# Patient Record
Sex: Male | Born: 1975 | Race: White | Hispanic: No | Marital: Married | Smoking: Never smoker
Health system: Southern US, Community
[De-identification: ages and names within clinical notes are randomized; demographics above are authoritative.]

## PROBLEM LIST (undated history)

## (undated) DIAGNOSIS — I10 Essential (primary) hypertension: Secondary | ICD-10-CM

## (undated) DIAGNOSIS — E669 Obesity, unspecified: Secondary | ICD-10-CM

## (undated) HISTORY — PX: OTHER SURGICAL HISTORY: SHX169

---

## 2005-05-28 ENCOUNTER — Emergency Department: Payer: Self-pay | Admitting: Internal Medicine

## 2007-05-22 ENCOUNTER — Ambulatory Visit: Payer: Self-pay | Admitting: Emergency Medicine

## 2010-05-19 ENCOUNTER — Ambulatory Visit: Payer: Self-pay | Admitting: Surgery

## 2010-05-26 ENCOUNTER — Ambulatory Visit: Payer: Self-pay | Admitting: Surgery

## 2013-12-23 DIAGNOSIS — I10 Essential (primary) hypertension: Secondary | ICD-10-CM | POA: Insufficient documentation

## 2013-12-23 DIAGNOSIS — Z9289 Personal history of other medical treatment: Secondary | ICD-10-CM | POA: Insufficient documentation

## 2013-12-23 DIAGNOSIS — E669 Obesity, unspecified: Secondary | ICD-10-CM | POA: Insufficient documentation

## 2013-12-23 DIAGNOSIS — E66813 Obesity, class 3: Secondary | ICD-10-CM | POA: Insufficient documentation

## 2014-08-23 ENCOUNTER — Emergency Department: Payer: Self-pay | Admitting: Emergency Medicine

## 2014-08-23 LAB — CBC
HCT: 45.1 % (ref 40.0–52.0)
HGB: 15.2 g/dL (ref 13.0–18.0)
MCH: 29 pg (ref 26.0–34.0)
MCHC: 33.6 g/dL (ref 32.0–36.0)
MCV: 86 fL (ref 80–100)
Platelet: 143 10*3/uL — ABNORMAL LOW (ref 150–440)
RBC: 5.22 10*6/uL (ref 4.40–5.90)
RDW: 13.5 % (ref 11.5–14.5)
WBC: 9.7 10*3/uL (ref 3.8–10.6)

## 2014-08-23 LAB — BASIC METABOLIC PANEL
Anion Gap: 5 — ABNORMAL LOW (ref 7–16)
BUN: 17 mg/dL (ref 7–18)
CALCIUM: 8.9 mg/dL (ref 8.5–10.1)
CHLORIDE: 105 mmol/L (ref 98–107)
Co2: 27 mmol/L (ref 21–32)
Creatinine: 1.05 mg/dL (ref 0.60–1.30)
EGFR (African American): >60
EGFR (Non-African Amer.): >60
Glucose: 135 mg/dL — ABNORMAL HIGH (ref 65–99)
Osmolality: 277 (ref 275–301)
POTASSIUM: 4.4 mmol/L (ref 3.5–5.1)
Sodium: 137 mmol/L (ref 136–145)

## 2015-03-23 ENCOUNTER — Ambulatory Visit
Admission: EM | Admit: 2015-03-23 | Discharge: 2015-03-23 | Disposition: A | Discharge status: Home / Self Care | Payer: BLUE CROSS/BLUE SHIELD | Attending: Family Medicine | Admitting: Family Medicine

## 2015-03-23 ENCOUNTER — Encounter: Payer: Self-pay | Admitting: Emergency Medicine

## 2015-03-23 DIAGNOSIS — J020 Streptococcal pharyngitis: Secondary | ICD-10-CM | POA: Insufficient documentation

## 2015-03-23 DIAGNOSIS — J0100 Acute maxillary sinusitis, unspecified: Secondary | ICD-10-CM | POA: Diagnosis not present

## 2015-03-23 DIAGNOSIS — J029 Acute pharyngitis, unspecified: Secondary | ICD-10-CM | POA: Diagnosis present

## 2015-03-23 DIAGNOSIS — Z79899 Other long term (current) drug therapy: Secondary | ICD-10-CM | POA: Diagnosis not present

## 2015-03-23 LAB — RAPID STREP SCREEN (MED CTR MEBANE ONLY): STREPTOCOCCUS, GROUP A SCREEN (DIRECT): POSITIVE — AB

## 2015-03-23 MED ORDER — AMOXICILLIN-POT CLAVULANATE 875-125 MG PO TABS: 1.0000 | ORAL_TABLET | Freq: Two times a day (BID) | ORAL | Status: DC

## 2015-03-23 NOTE — ED Provider Notes (Signed)
CSN: [id number]     Arrival date & time 03/23/15  1642 History   First MD Initiated Contact with Patient 03/23/15 1742     Chief Complaint  Patient presents with  . Sore Throat   (Consider location/radiation/quality/duration/timing/severity/associated sxs/prior Treatment) Patient is a 39 y.o. male presenting with pharyngitis. The history is provided by the patient.  Sore Throat This is a new problem. The current episode started more than 1 week ago (3 weeks). The problem occurs constantly. The problem has not changed since onset.Associated symptoms comments: Sinus pressure, sinus headaches, nasal congestion, sweats.    History reviewed. No pertinent past medical history. History reviewed. No pertinent past surgical history. History reviewed. No pertinent family history. History  Substance Use Topics  . Smoking status: Never Smoker   . Smokeless tobacco: Never Used  . Alcohol Use: No    Review of Systems  Allergies  Review of patient's allergies indicates no known allergies.  Home Medications   Prior to Admission medications   Medication Sig Start Date End Date Taking? Authorizing Provider  amphetamine-dextroamphetamine (ADDERALL XR) 10 MG 24 hr capsule Take 10 mg by mouth daily.   Yes Historical Provider, MD  lisinopril (PRINIVIL,ZESTRIL) 10 MG tablet Take 10 mg by mouth daily.   Yes Historical Provider, MD  amoxicillin-clavulanate (AUGMENTIN) 875-125 MG per tablet Take 1 tablet by mouth 2 (two) times daily. 03/23/15   Payton Mccallum, MD   BP 127/29 mmHg  Pulse 102  Temp(Src) 99.1 F (37.3 C) (Tympanic)  Ht  (1.854 m)  Wt 325 lb (147.419 kg)  BMI 42.89 kg/m2  SpO2 93% Physical Exam  Constitutional: He appears well-developed and well-nourished. No distress.  HENT:  Head: Normocephalic and atraumatic.  Right Ear: Tympanic membrane, external ear and ear canal normal.  Left Ear: Tympanic membrane, external ear and ear canal normal.  Nose: Right sinus exhibits  maxillary sinus tenderness and frontal sinus tenderness. Left sinus exhibits maxillary sinus tenderness and frontal sinus tenderness.  Mouth/Throat: Uvula is midline and mucous membranes are normal. Posterior oropharyngeal erythema present. No oropharyngeal exudate, posterior oropharyngeal edema or tonsillar abscesses.  Eyes: Conjunctivae and EOM are normal. Pupils are equal, round, and reactive to light. Right eye exhibits no discharge. Left eye exhibits no discharge. No scleral icterus.  Neck: Normal range of motion. Neck supple. No tracheal deviation present. No thyromegaly present.  Cardiovascular: Normal rate, regular rhythm and normal heart sounds.   Pulmonary/Chest: Effort normal and breath sounds normal. No stridor. No respiratory distress. He has no wheezes. He has no rales. He exhibits no tenderness.  Lymphadenopathy:    He has no cervical adenopathy.  Neurological: He is alert.  Skin: Skin is warm and dry. No rash noted. He is not diaphoretic.  Nursing note and vitals reviewed.   ED Course  Procedures (including critical care time) Labs Review Labs Reviewed  RAPID STREP SCREEN (NOT AT [facility]) - Abnormal; Notable for the following:    Streptococcus, Group A Screen (Direct) POSITIVE (*)    All other components within normal limits    Imaging Review No results found.   MDM   1. Acute maxillary sinusitis, recurrence not specified   2. Strep pharyngitis    Discharge Medication List as of 03/23/2015  6:09 PM    START taking these medications   Details  amoxicillin-clavulanate (AUGMENTIN) 875-125 MG per tablet Take 1 tablet by mouth 2 (two) times daily., Starting 03/23/2015, Until Discontinued, Normal      Plan: 1. Test results  and diagnosis reviewed with patient 2. rx as per orders; risks, benefits, potential side effects reviewed with patient 3. Recommend supportive treatment with salt water gargles, otc analgesics 4. F/u prn if symptoms worsen or don't  improve    Payton Mccallum, MD 03/23/15 [phone]

## 2015-03-23 NOTE — ED Notes (Signed)
Patient states he has a sore throat and sinus drainage with cough persistently for 3 weeks. OTC medications aren't helping

## 2016-07-31 ENCOUNTER — Ambulatory Visit
Admission: AD | Admit: 2016-07-31 | Discharge: 2016-07-31 | Disposition: A | Discharge status: Home / Self Care | Payer: Self-pay

## 2016-07-31 DIAGNOSIS — L03116 Cellulitis of left lower limb: Secondary | ICD-10-CM

## 2016-07-31 HISTORY — DX: Essential (primary) hypertension: I10

## 2016-07-31 MED ORDER — DOXYCYCLINE HYCLATE 100 MG PO TABS *I*
100.0000 mg | ORAL_TABLET | Freq: Two times a day (BID) | ORAL | 0 refills | Status: AC
Start: 2016-07-31 — End: 2016-08-10

## 2016-07-31 NOTE — Discharge Instructions (Signed)
Recommend warm compress application 3 times daily for 15-20 minutes.     Recommend otc Tylenol and/or Motrin as needed for pain/fever relief. Use as directed on package labeling.      Elevate your legs.

## 2016-07-31 NOTE — UC Provider Note (Signed)
History     Chief Complaint   Patient presents with    Abscess     Abscess x one week on left lateral left - pt states he has swelling in his toes as well - traveling "[place] and down" the [employer] for work     Patient is a 40 y.o. male presenting with abscess.   History provided by:  Patient  Language interpreter used: No    Is this ED visit related to civilian activity for income:  Not work related  Abscess   Location:  Leg  Leg abscess location:  L lower leg  Abscess quality: induration, painful and redness    Red streaking: no    Duration:  3 days  Progression:  Worsening  Pain details:     Quality:  Sharp and shooting  Chronicity:  New  Context: not diabetes, not immunosuppression, not injected drug use and not insect bite/sting    Relieved by:  Draining/squeezing  Worsened by:  Nothing  Associated symptoms: no anorexia, no fatigue, no fever, no headaches, no nausea and no vomiting    Risk factors: no family hx of MRSA, no hx of MRSA and no prior abscess    Patient reports a "pimple" that he popped to his left lower leg 1 week ago. He states the area is now hard, tender and painful. He thinks it may have gotten infected. He has multiple boils to his groin and bilateral axilla. 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 He has a history of dependent edema in his legs that typically resolves with rest and elevation. He denies any fever, chills, sweats, chest pain, shortness of breath, cough, drainage from the leg, red streaking, calf pain. He has a hx of HTN - typically takes Lisinopril but ran out of his medication 1 year ago.     Past Medical History:   Diagnosis Date    Hypertension             History reviewed. No pertinent surgical history.    History reviewed. No pertinent family history.      Social History    reports that he has never smoked. He has never used smokeless tobacco. He reports that he does not drink alcohol. His drug and sexual activity histories are not on  file.    Living Situation     Questions Responses    Patient lives with     Homeless     Caregiver for other family member     External Services     Employment     Domestic Violence Risk           Review of Systems   Review of Systems   Constitutional: Negative for chills, diaphoresis, fatigue and fever.   Respiratory: Negative for cough, chest tightness, shortness of breath, wheezing and stridor.    Cardiovascular: Positive for leg swelling. Negative for chest pain and palpitations.   Gastrointestinal: Negative for anorexia, nausea and vomiting.   Genitourinary: Negative for decreased urine volume and difficulty urinating.   Musculoskeletal: Negative for gait problem.        Left leg pain   Skin:        Left leg boil with redness and swelling   Neurological: Negative for dizziness, light-headedness, numbness and headaches.   All other systems reviewed and are negative.      Physical Exam     ED Triage Vitals  BP Heart Rate Heart Rate (via Pulse Ox) Resp Temp Temp src SpO2 O2 Device O2 Flow Rate   07/31/16 2008 07/31/16 2008 -- 07/31/16 2008 07/31/16 2008 -- 07/31/16 2008 -- --   146/79 110  22 37.4 C (99.3 F)  94 %        Weight           --                               Physical Exam   Constitutional: He is oriented to person, place, and time. He appears well-developed and well-nourished. No distress.   HENT:   Head: Normocephalic and atraumatic.   Cardiovascular: Normal rate, regular rhythm, normal heart sounds and intact distal pulses.  Exam reveals no gallop and no friction rub.    No murmur heard.  Pulmonary/Chest: Effort normal and breath sounds normal. No respiratory distress. He has no wheezes. He has no rales.   Musculoskeletal:        Right lower leg: He exhibits swelling and edema. He exhibits no tenderness.        Left lower leg: He exhibits swelling and edema.        Legs:  Negative Homan's sign bilaterally. There is 2+ nonpitting edema noted to both lower legs. Both lower legs measure symmetrically  at 51 cm. No palpable cords, varicosities or calf tenderness.    Neurological: He is alert and oriented to person, place, and time.   Skin: Skin is warm and dry.   Psychiatric: He has a normal mood and affect. His behavior is normal. Judgment and thought content normal.   Nursing note and vitals reviewed.       Medical Decision Making        Initial Evaluation:  ED First Provider Contact     Date/Time Event User Comments    07/31/16 2002 ED Provider First Contact [place] to [facility] Provider Contact          Patient was seen on: 07/31/2016        Assessment:  39 y.o.male comes to the Ur[facility] with left lower leg pain at the site of an infected pimple with some redness and swelling x 3 days.     Differential Diagnosis includes Skin infection, Cellulitis, Abscess, TSS, Necrotizing fasciitis, erythema migrans, herpes zoster, septic arthritis/bursitis, osteomyelitis, contact dermatitis, vasculitis, acute gout, insect bite, DVT, stasis dermatitis, hydradenitis suppurativa, folliculitis.                 Plan:   Orders Placed This Encounter    doxycycline (VIBRA-TABS) 100 MG tablet       No results found for this or any previous visit (from the past 24 hour(s)).       Final Diagnosis    ICD-10-CM ICD-9-CM   1. Cellulitis of left leg L03.116 682.6       Encourage fluids, encourage rest, good hand hygiene.    Use over the counter medications as discussed.    Discharge Instructions    References/Attachments: Go to References/Attachments    CELLULITIS, ADULT (ENGLISH) View    Patient's Language: Unknown   Instructions      Recommend warm compress application 3 times daily for 15-20 minutes.     Recommend otc Tylenol and/or Motrin as needed for pain/fever relief. Use as directed on package labeling.      Elevate your legs.  Please start the new medications as below:    Current Discharge Medication List      New Medications    Details Last Dose Given Next Dose Due Script Given?   doxycycline  (VIBRA-TABS) 100 mg Dose: 100 mg  Take 100 mg by mouth 2 times daily  Quantity 20 tablet, Refill 0  Start date: 07/31/2016, End date: 08/10/2016       Comments: Emergency Encounter                   Please follow up with your physician as below:    Follow-up Information     Follow up with in the ED In 2 days.    Why:  If symptoms worsen, If symptoms do not improve        If short of breath, chest pains, worsening symptoms, or any other concerns please report to the emergency room.    In the event of an Emergency dial 911.     Final Diagnosis  Final diagnoses:   [L03.116] Cellulitis of left leg (Primary)           Concha Pyo, PA    Supervising physician Dorna Bloom, MD was immediately available     Concha Pyo, [place]  07/31/16 2049

## 2016-07-31 NOTE — ED Triage Notes (Signed)
Abscess x one week on left lateral left - pt states he has swelling in his toes as well [place]    Triage Note   Beverly Milchelia R Catlynn Grondahl, RN

## 2018-03-25 ENCOUNTER — Other Ambulatory Visit: Payer: Self-pay

## 2018-03-25 ENCOUNTER — Encounter: Payer: Self-pay | Admitting: Emergency Medicine

## 2018-03-25 ENCOUNTER — Ambulatory Visit
Admission: EM | Admit: 2018-03-25 | Discharge: 2018-03-25 | Disposition: A | Discharge status: Home / Self Care | Payer: BLUE CROSS/BLUE SHIELD | Attending: Family Medicine | Admitting: Family Medicine

## 2018-03-25 DIAGNOSIS — R21 Rash and other nonspecific skin eruption: Secondary | ICD-10-CM

## 2018-03-25 DIAGNOSIS — L255 Unspecified contact dermatitis due to plants, except food: Secondary | ICD-10-CM

## 2018-03-25 MED ORDER — MUPIROCIN 2 % EX OINT: 1.0000 "application " | TOPICAL_OINTMENT | Freq: Three times a day (TID) | CUTANEOUS | 0 refills | Status: DC

## 2018-03-25 MED ORDER — PREDNISONE 10 MG (21) PO TBPK: ORAL_TABLET | Freq: Every day | ORAL | 0 refills | Status: DC

## 2018-03-25 NOTE — Discharge Instructions (Signed)
Use Benadryl 50 mg at bedtime to help prevent itching at night.  In the daytime use Claritin Allegra or Zyrtec for itching.

## 2018-03-25 NOTE — ED Provider Notes (Signed)
[facility]    CSN: [id number] Arrival date & time: 03/25/18  1720     History   Chief Complaint Chief Complaint  Patient presents with  . Rash    HPI Lucas FelixJames C Matus Jr. is a 42 y.o. male.   HPI  42 year old male presents with a itchy rash all over the body for the past 9 days.  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  Shortly afterwards he noticed that the itching was severe.  It is spread from his arms to his torso and arch patch over his posterior O lateral left hip.       History reviewed. No pertinent past medical history.  There are no active problems to display for this patient.   History reviewed. No pertinent surgical history.     Home Medications    Prior to Admission medications   Medication Sig Start Date End Date Taking? Authorizing Provider  amphetamine-dextroamphetamine (ADDERALL XR) 10 MG 24 hr capsule Take 10 mg by mouth daily.   Yes [provider]  lisinopril (PRINIVIL,ZESTRIL) 10 MG tablet Take 10 mg by mouth daily.   Yes [provider]  mupirocin ointment (BACTROBAN) 2 % Apply 1 application topically 3 (three) times daily. 03/25/18   Lutricia Feiloemer, Lucetta Baehr P, PA-C  predniSONE (STERAPRED UNI-PAK 21 TAB) 10 MG (21) TBPK tablet Take by mouth daily. Take 6 tabs by mouth daily  for 2 days, then 5 tabs for 2 days, then 4 tabs for 2 days, then 3 tabs for 2 days, 2 tabs for 2 days, then 1 tab by mouth daily for 2 days 03/25/18   Lutricia Feiloemer, Avrum Kimball P, PA-C    Family History History reviewed. No pertinent family history.  Social History Social History   Tobacco Use  . Smoking status: Never Smoker  . Smokeless tobacco: Never Used  Substance Use Topics  . Alcohol use: No  . Drug use: No     Allergies   Patient has no known allergies.   Review of Systems Review of Systems  Constitutional: Positive for activity change. Negative for  chills, fatigue and fever.  Skin: Positive for rash and wound.  All other systems reviewed and are negative.    Physical Exam Triage Vital Signs ED Triage Vitals  Enc Vitals Group     BP 03/25/18 1837 (!) 134/94     Pulse Rate 03/25/18 1837 91     Resp 03/25/18 1837 16     Temp 03/25/18 1837 98.1 F (36.7 C)     Temp Source 03/25/18 1837 Oral     SpO2 03/25/18 1837 95 %     Weight 03/25/18 1834 (!) 400 lb (181.4 kg)     Height 03/25/18 1834 6\' 1"  (1.854 m)     Head Circumference --      Peak Flow --      Pain Score 03/25/18 1834 0     Pain Loc --      Pain Edu? --      Excl. in GC? --    No data found.  Updated Vital Signs BP (!) 134/94 (BP Location: Right Arm)   Pulse 91   Temp 98.1 F (36.7 C) (Oral)   Resp 16   Ht 6\' 1"  (1.854 m)   Wt (!) 400 lb (181.4 kg)   SpO2 95%   BMI 52.77 kg/m   Visual Acuity Right  Eye Distance:   Left Eye Distance:   Bilateral Distance:    Right Eye Near:   Left Eye Near:    Bilateral Near:     Physical Exam  Constitutional: He is oriented to person, place, and time. He appears well-developed and well-nourished. No distress.  HENT:  Head: Normocephalic.  Eyes: Pupils are equal, round, and reactive to light. Right eye exhibits no discharge. Left eye exhibits no discharge.  Neck: Normal range of motion.  Musculoskeletal: Normal range of motion.  Lymphadenopathy:    He has no cervical adenopathy.  Neurological: He is alert and oriented to person, place, and time.  Skin: Skin is warm and dry. Rash noted. He is not diaphoretic.  Emanation shows extensive rash with erythematous base and several areas of excoriations that are on the verge of infection.  Has there is a small linear vesicles present as well.  Psychiatric: He has a normal mood and affect. His behavior is normal. Judgment and thought content normal.  Nursing note and vitals reviewed.    UC Treatments / Results  Labs (all labs ordered are listed, but only abnormal  results are displayed) Labs Reviewed - No data to display  EKG None  Radiology No results found.  Procedures Procedures (including critical care time)  Medications Ordered in UC Medications - No data to display  Initial Impression / Assessment and Plan / UC Course  I have reviewed the triage vital signs and the nursing notes.  Pertinent labs & imaging results that were available during my care of the patient were reviewed by me and considered in my medical decision making (see chart for details).     Plan: 1. Test/x-ray results and diagnosis reviewed with patient 2. rx as per orders; risks, benefits, potential side effects reviewed with patient 3. Recommend supportive treatment with Benadryl at nighttime and Zyrtec Allegra or Claritin during daytime for itching.  To my prednisone for a 12-day taper.  Not improving he should follow-up with the dermatologist. 4. F/u prn if symptoms worsen or don't improve  Final Clinical Impressions(s) / UC Diagnoses   Final diagnoses:  Dermatitis due to plants, including poison ivy, sumac, and oak     Discharge Instructions     Use Benadryl 50 mg at bedtime to help prevent itching at night.  In the daytime use Claritin Allegra or Zyrtec for itching.    ED Prescriptions    Medication Sig Dispense Auth. Provider   predniSONE (STERAPRED UNI-PAK 21 TAB) 10 MG (21) TBPK tablet Take by mouth daily. Take 6 tabs by mouth daily  for 2 days, then 5 tabs for 2 days, then 4 tabs for 2 days, then 3 tabs for 2 days, 2 tabs for 2 days, then 1 tab by mouth daily for 2 days 42 tablet Ovid Curd P, PA-C   mupirocin ointment (BACTROBAN) 2 % Apply 1 application topically 3 (three) times daily. 22 g Lutricia Feil, PA-C     Controlled Substance Prescriptions [place] Controlled Substance Registry consulted? Not Applicable   Lutricia Feil, PA-C 03/25/18 1905

## 2018-03-25 NOTE — ED Triage Notes (Signed)
Patient c/o itchy rash all over his body for the past 9 days.

## 2020-01-07 ENCOUNTER — Other Ambulatory Visit: Payer: Self-pay

## 2020-01-07 ENCOUNTER — Ambulatory Visit
Admission: EM | Admit: 2020-01-07 | Discharge: 2020-01-07 | Disposition: A | Discharge status: Home / Self Care | Payer: Self-pay | Attending: Emergency Medicine | Admitting: Emergency Medicine

## 2020-01-07 DIAGNOSIS — Z760 Encounter for issue of repeat prescription: Secondary | ICD-10-CM | POA: Insufficient documentation

## 2020-01-07 DIAGNOSIS — I10 Essential (primary) hypertension: Secondary | ICD-10-CM | POA: Insufficient documentation

## 2020-01-07 HISTORY — DX: Obesity, unspecified: E66.9

## 2020-01-07 LAB — BASIC METABOLIC PANEL
Anion gap: 10 (ref 5–15)
BUN: 16 mg/dL (ref 6–20)
CO2: 29 mmol/L (ref 22–32)
Calcium: 8.9 mg/dL (ref 8.9–10.3)
Chloride: 99 mmol/L (ref 98–111)
Creatinine, Ser: 0.95 mg/dL (ref 0.61–1.24)
GFR calc Af Amer: >60 mL/min (ref >60)
GFR calc non Af Amer: >60 mL/min (ref >60)
Glucose, Bld: 156 mg/dL — ABNORMAL HIGH (ref 70–99)
Potassium: 3.8 mmol/L (ref 3.5–5.1)
Sodium: 138 mmol/L (ref 135–145)

## 2020-01-07 MED ORDER — LISINOPRIL 10 MG PO TABS: 10.0000 mg | ORAL_TABLET | Freq: Every day | ORAL | 0 refills | Status: DC

## 2020-01-07 NOTE — Discharge Instructions (Addendum)
Your kidney function and electrolytes were normal today.  Decrease your salt intake.  diet and exercise will lower your blood pressure significantly. It is important to keep your blood pressure under good control, as having a elevated blood pressure for prolonged periods of time significantly increases your risk of stroke, heart attacks, kidney damage, eye damage, and other problems. Measure your blood pressure once a day, preferably at the same time every day. Keep a log of this and bring it to your next doctor's appointment.  Bring your blood pressure cuff as well.  Return here in 2 weeks for blood pressure recheck if you're unable to find a primary care physician by then.  Cut back on the [facility] is much as you can.  Return immediately to the ER if you start having chest pain, headache, problems seeing, problems talking, problems walking, if you feel like you're about to pass out, if you do pass out, if you have a seizure, or for any other concerns.  Here is a list of primary care providers who are taking new patients:  Dr. Elizabeth Sauer, Dr. Schuyler Amor [address] [place] [address] [phone]  [facility] [address] [place] [address]  [phone]  [facility] [address] [place], [place] [address] [phone]  [facility] [address] [place]  [phone] [place], [place] [address]  Here are clinics/ other resources who will see you if you do not have insurance. Some have certain criteria that you must meet. Call them and find out what they are:  [facility]: [address] [place], [place] [address] Phone: [phone] Hours: First and Third Saturdays of each Month, 9 a.m. - 1 p.m.  [facility]: [address], [address] [place], [place] [address] Phone: [phone] Hours: Tuesday, 4 p.m. - 8 p.m. Thursday, 1 p.m. - 8 p.m. Wednesday, 9 a.m. - [facility] [address], [place], [place] [address] Phone: [phone] Pharmacy Phone Number: [phone] Dental Phone Number: [phone] [facility] Insurance Help: [phone]  Dental Hours: Monday - Thursday, 8 a.m. - 6 p.m.  [facility] [address], [place], [place] [address] Phone: [phone] Pharmacy Phone Number: [phone] [facility] Insurance Help: [phone]  [facility] [address] [place] [place], [place] [address] Phone: [phone] Pharmacy Phone Number: [phone] [facility] Insurance Help: [phone]  [facility] [address] [place], [place] [address] Phone: [phone] [facility] Insurance Help: [phone]   [facility] [address] [place], [place] [address] Phone: [phone]  Go to www.goodrx.com to look up your medications. This will give you a list of where you can find your prescriptions at the most affordable prices. Or ask the pharmacist what the cash price is, or if they have any other discount programs available to help make your medication more affordable. This can be less expensive than what you would pay with insurance.

## 2020-01-07 NOTE — ED Triage Notes (Signed)
Pt presents with c/o history of HTN and has not taken any medications in several years. Pt reports he does not currently have a PCP. Pt states he recently had readings of 150/120-160/120. Pt states he does check his BP regularly and it has been running high for over a year.

## 2020-01-07 NOTE — ED Provider Notes (Signed)
HPI  SUBJECTIVE:  Lucas Pierce. is a 44 y.o. 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  Patient states that they measured his blood pressure twice and it was 154/120 repeat was 160/120.  He was told that he needed to see an MD prior to being cleared to return for evaluation and came here as he does not have a primary care physician.  He states that he was on lisinopril 20 mg daily and has not been on any in the past year and a half.  He also states that he used to measure his blood pressure daily at home, but has also not done so in the past year and a half.  He states that the lisinopril 20 mg was working well for him.  He denies chest pain, headache, shortness of breath, arm or leg weakness, facial droop, visual changes, tearing pain through to his back, abdominal pain anuria, hematuria, seizures, syncope, worsening lower extremity edema.  No illicit drug or decongestant use.  Patient states that he drinks a case of [facility] a day.  He denies alcohol use.  Past medical history of hypertension, morbid obesity.  No history of diabetes, chronic kidney disease. PMD: None.  Patient's blood pressure last several visits at [facility] in February/March 2020 were 166/97, 151/93, 157/107.    Past Medical History:  Diagnosis Date  . Obesity     History reviewed. No pertinent surgical history.  Family History  Problem Relation Age of Onset  . Healthy Mother   . Healthy Father     Social History   Tobacco Use  . Smoking status: Never Smoker  . Smokeless tobacco: Never Used  Substance Use Topics  . Alcohol use: No  . Drug use: No    No current facility-administered medications for this encounter.  Current Outpatient Medications:  .  lisinopril (ZESTRIL) 10 MG tablet, Take 1 tablet (10 mg total) by mouth daily., Disp: 30 tablet, Rfl: 0  No Known Allergies   ROS  As noted in HPI.   Physical Exam  BP (!) 138/95 (BP Location: Left  Arm)   Pulse 95   Temp 97.7 F (36.5 C) (Oral)   Resp 19   Ht 6\' 1"  (1.854 m)   Wt (!) 179.2 kg   SpO2 92%   BMI 52.11 kg/m    BP Readings from Last 3 Encounters:  01/07/20 (!) 138/95  03/25/18 (!) 134/94  03/23/15 (!) 127/29   Constitutional: Well developed, well nourished, no acute distress.  Morbidly obese. Eyes:  EOMI, conjunctiva normal bilaterally HENT: Normocephalic, atraumatic,mucus membranes moist Respiratory: Normal inspiratory effort lungs clear bilaterally. Cardiovascular: Normal rate regular rhythm no murmurs rubs gallops GI: nondistended skin: No rash, skin intact Musculoskeletal: no deformities trace edema lower extremities bilaterally. Neurologic: Alert & oriented x 3, no focal neuro deficits Psychiatric: Speech and behavior appropriate   ED Course   Medications - No data to display  Orders Placed This Encounter  Procedures  . Basic metabolic panel    Standing Status:   Standing    Number of Occurrences:   1    Results for orders placed or performed during the hospital encounter of 01/07/20 (from the past 24 hour(s))  Basic metabolic panel     Status: Abnormal   Collection Time: 01/07/20  2:39 PM  Result Value Ref Range   Sodium 138 135 - 145 mmol/L   Potassium 3.8 3.5 - 5.1 mmol/L  Chloride 99 98 - 111 mmol/L   CO2 29 22 - 32 mmol/L   Glucose, Bld 156 (H) 70 - 99 mg/dL   BUN 16 6 - 20 mg/dL   Creatinine, Ser 5.63 0.61 - 1.24 mg/dL   Calcium 8.9 8.9 - 89.3 mg/dL   GFR calc non Af Amer >60 >60 mL/min   GFR calc Af Amer >60 >60 mL/min   Anion gap 10 5 - 15   No results found.  ED Clinical Impression  1. Essential hypertension   2. Medication refill      ED Assessment/Plan  Outside records reviewed.  As noted in HPI.   Patient states that he is not sure what his blood pressures been running for the past year and a half since he has been off of his medications.  His blood pressure is borderline hypertensive here today, however I have  records of his blood pressure being elevated three times. Will check a BMP since his last lab work was over a year ago.  Will start him on lisinopril 10 mg as this worked well for him in the past, have him keep a log of his blood pressure at home.  We will give him a list of primary care providers for him to follow-up with.  Discussed with him that he needs to follow-up with somebody in the next week or 2 for reevaluation, further management, and for clearance to perform employment test.  Discussed with him that he may need to discontinue the lisinopril if he starts to have side effects such as dizziness, lightheadedness, or if his blood pressure becomes low.  BMP unremarkable.  Kidney function is normal.  Plan as above.  Patient is requesting a note stating his blood pressure here,  the interventions done today, and plan for follow-up, as I am not comfortable with filling out the form that he provided to me.  Discussed labs,  MDM, treatment plan, and plan for follow-up with patient. Discussed sn/sx that should prompt return to the ED. patient agrees with plan.   Meds ordered this encounter  Medications  . lisinopril (ZESTRIL) 10 MG tablet    Sig: Take 1 tablet (10 mg total) by mouth daily.    Dispense:  30 tablet    Refill:  0    *This clinic note was created using Scientist, clinical (histocompatibility and immunogenetics). Therefore, there may be occasional mistakes despite careful proofreading.   ?    Domenick Gong, MD 01/07/20 1635

## 2020-04-20 ENCOUNTER — Telehealth: Payer: Self-pay

## 2020-04-20 ENCOUNTER — Other Ambulatory Visit: Payer: Self-pay

## 2020-04-20 ENCOUNTER — Ambulatory Visit
Admission: EM | Admit: 2020-04-20 | Discharge: 2020-04-20 | Disposition: A | Discharge status: Home / Self Care | Payer: BC Managed Care – PPO | Attending: Emergency Medicine | Admitting: Emergency Medicine

## 2020-04-20 ENCOUNTER — Ambulatory Visit (INDEPENDENT_AMBULATORY_CARE_PROVIDER_SITE_OTHER): Payer: BC Managed Care – PPO

## 2020-04-20 DIAGNOSIS — I10 Essential (primary) hypertension: Secondary | ICD-10-CM | POA: Insufficient documentation

## 2020-04-20 DIAGNOSIS — Z760 Encounter for issue of repeat prescription: Secondary | ICD-10-CM | POA: Diagnosis present

## 2020-04-20 DIAGNOSIS — M109 Gout, unspecified: Secondary | ICD-10-CM | POA: Insufficient documentation

## 2020-04-20 DIAGNOSIS — M79672 Pain in left foot: Secondary | ICD-10-CM

## 2020-04-20 LAB — URIC ACID: Uric Acid, Serum: 8.8 mg/dL — ABNORMAL HIGH (ref 3.7–8.6)

## 2020-04-20 MED ORDER — LISINOPRIL 20 MG PO TABS: 20.0000 mg | ORAL_TABLET | Freq: Every day | ORAL | 2 refills | Status: DC

## 2020-04-20 MED ORDER — INDOMETHACIN 25 MG PO CAPS: 50.0000 mg | ORAL_CAPSULE | Freq: Three times a day (TID) | ORAL | 0 refills | Status: AC | PRN

## 2020-04-20 MED ORDER — PREDNISONE 20 MG PO TABS: 40.0000 mg | ORAL_TABLET | Freq: Every day | ORAL | 0 refills | Status: AC

## 2020-04-20 MED ORDER — COLCHICINE 0.6 MG PO TABS: ORAL_TABLET | ORAL | 0 refills | Status: DC

## 2020-04-20 NOTE — ED Triage Notes (Signed)
Patient states that he is here for possible gout in his left big toe. States that this started over the last few days.

## 2020-04-20 NOTE — ED Provider Notes (Signed)
HPI  SUBJECTIVE:  Lucas Pierce. is a 44 y.o. male who presents with sharp pain at the first left MTP starting this morning.  States that it is hypersensitive, erythematous, swollen.  States that he has been eating a lot of seafood and red meat recently.  No trauma, change in his physical activity.  No numbness, tingling, fevers, body aches.  Denies pain or injuries to the rest of the foot or ankle.  He has been taking Advil 400 mg every 6 hours and tried an unknown over-the-counter "fluid pill".  He states that the Advil slightly helps temporarily.  Symptoms worse with weightbearing, toe movement at the MTP.  He states that he has had similar symptoms over the past month, but that it has resolved each time on its own.  He has a past medical history of hypertension, states that he is compliant with his 20 mg of lisinopril.  He is overweight, states that he is in the process of losing weight.  No history of diabetes, gout, chronic kidney disease.  PMD: None.   Past Medical History:  Diagnosis Date  . Obesity     History reviewed. No pertinent surgical history.  Family History  Problem Relation Age of Onset  . Healthy Mother   . Healthy Father     Social History   Tobacco Use  . Smoking status: Never Smoker  . Smokeless tobacco: Never Used  Vaping Use  . Vaping Use: Never used  Substance Use Topics  . Alcohol use: No  . Drug use: No    No current facility-administered medications for this encounter.  Current Outpatient Medications:  .  colchicine 0.6 MG tablet, 2 tabs po x 1, then one tab po 1 hour later, Disp: 6 tablet, Rfl: 0 .  indomethacin (INDOCIN) 25 MG capsule, Take 2 capsules (50 mg total) by mouth 3 (three) times daily as needed for up to 5 days., Disp: 30 capsule, Rfl: 0 .  lisinopril (ZESTRIL) 20 MG tablet, Take 1 tablet (20 mg total) by mouth daily., Disp: 30 tablet, Rfl: 2 .  predniSONE (DELTASONE) 20 MG tablet, Take 2 tablets (40 mg total) by mouth daily with  breakfast for 5 days., Disp: 10 tablet, Rfl: 0  No Known Allergies   ROS  As noted in HPI.   Physical Exam  BP (!) 165/102 (BP Location: Left Arm)   Pulse 94   Temp 98.2 F (36.8 C) (Oral)   Resp 16   Ht 6\' 1"  (1.854 m)   Wt (!) 172.4 kg   SpO2 94%   BMI 50.13 kg/m   Constitutional: Well developed, well nourished, no acute distress Eyes:  EOMI, conjunctiva normal bilaterally HENT: Normocephalic, atraumatic,mucus membranes moist Respiratory: Normal inspiratory effort Cardiovascular: Normal rate GI: nondistended skin: No rash, skin intact Musculoskeletal: Swelling of the dorsum of the left foot.  Left foot erythematous.    Positive tenderness at the first MTP.  No tenderness along the midfoot, metatarsals, phalanges.  Patient able to move  first toe.  Sensation grossly intact.  No tenderness over the calcaneus, ankle, or the rest of the foot.  DP 2+.  Skin intact.        Neurologic: Alert & oriented x 3, no focal neuro deficits Psychiatric: Speech and behavior appropriate   ED Course   Medications - No data to display  Orders Placed This Encounter  Procedures  . DG Foot Complete Left    Standing Status:   Standing  Number of Occurrences:   1    Order Specific Question:   Reason for Exam (SYMPTOM  OR DIAGNOSIS REQUIRED)    Answer:   Pain, tenderness first MTP.  Rule out fracture.  Concern for gout.  . Uric acid    Standing Status:   Standing    Number of Occurrences:   1    Results for orders placed or performed during the hospital encounter of 04/20/20 (from the past 24 hour(s))  Uric acid     Status: Abnormal   Collection Time: 04/20/20  2:53 PM  Result Value Ref Range   Uric Acid, Serum 8.8 (H) 3.7 - 8.6 mg/dL   DG Foot Complete Left  Result Date: 04/20/2020 CLINICAL DATA:  Pain and tenderness the first MTP joint. Question gout. Pain over the last few days. EXAM: LEFT FOOT - COMPLETE 3+ VIEW COMPARISON:  None. FINDINGS: Soft swelling is noted medial  to the left first MTP joint. No osseous changes are present. No rosea insert noted. Joint space is preserved. Mineralization is normal. No acute or focal osseous abnormalities are present. IMPRESSION: 1. Soft tissue swelling medial to the left first MTP joint without osseous changes. This is nonspecific. While this may represent inflammatory arthritis, posttraumatic changes could give a similar appearance. 2. No acute or focal osseous abnormality. Electronically Signed   By: Marin Roberts M.D.   On: 04/20/2020 15:28   ED Clinical Impression  1. Foot pain, left   2. Acute gout involving toe of left foot, unspecified cause   3. Essential hypertension   4. Medication refill      ED Assessment/Plan  Kidney function from 3/21 normal.  Patient states that his high blood pressure has been under good control at home recently with a 20 mg of lisinopril daily.  States it is elevated because of the pain.  XR foot, uric acid. Will not repeat BMP as this was recently done.   Reviewed imaging independently. Soft tissue swelling medial to the left first MTP joint without osseous changes.  No fracture.  See radiology report for full details. Uric acid slightly elevated.  Presentation suggestive of gout.  Home with colchicine, prednisone, indomethicin, tylenol, ice, elevation, f/u with podiatry.  Dr. Excell Seltzer on call.  Will contact patient [phone] if uric acid is elevated.  Called pt and discussed elevated uric acid.  Answered all questions.  Patient also requesting refill of lisinopril 20 mg for his hypertension.  States it controls his blood pressure well.  States that he has been taking 20 mg for the past several weeks.  Again encouraged primary care follow-up for monitoring of his blood pressure and to continue keeping a log of it.   will give him 3 months of medication to give him time to find a PMD-states that he called all the practices on the list that I gave him last time, there is no  availability until November.  Discussed labs, imaging, MDM, treatment plan, and plan for follow-up with patientpatient agrees with plan.   Meds ordered this encounter  Medications  . colchicine 0.6 MG tablet    Sig: 2 tabs po x 1, then one tab po 1 hour later    Dispense:  6 tablet    Refill:  0  . predniSONE (DELTASONE) 20 MG tablet    Sig: Take 2 tablets (40 mg total) by mouth daily with breakfast for 5 days.    Dispense:  10 tablet    Refill:  0  .  indomethacin (INDOCIN) 25 MG capsule    Sig: Take 2 capsules (50 mg total) by mouth 3 (three) times daily as needed for up to 5 days.    Dispense:  30 capsule    Refill:  0  . lisinopril (ZESTRIL) 20 MG tablet    Sig: Take 1 tablet (20 mg total) by mouth daily.    Dispense:  30 tablet    Refill:  2    *This clinic note was created using Scientist, clinical (histocompatibility and immunogenetics). Therefore, there may be occasional mistakes despite careful proofreading.   ?    Domenick Gong, MD 04/21/20 [phone]

## 2020-04-20 NOTE — Discharge Instructions (Addendum)
Your x-ray was negative for fracture.  Take 1000 mg of Tylenol 3-4 times a day as needed for pain.  Take the indomethacin as written.  Finish the prednisone.  Take the colchicine today-2 tabs now, then 1 tab an hour later.  This may abort the gout attack.  I will call you only if your uric acid is elevated.  You can also call here and get the results in about 4-5 hours.  Ice, elevate.  Follow-up with Dr. Excell Seltzer, podiatry.  Continue taking your lisinopril.  Continue keeping a log of your blood pressure.  Follow-up with your primary care physician of your choice as soon as you can.

## 2020-08-16 ENCOUNTER — Other Ambulatory Visit: Payer: Self-pay

## 2020-08-16 ENCOUNTER — Ambulatory Visit
Admission: EM | Admit: 2020-08-16 | Discharge: 2020-08-16 | Disposition: A | Discharge status: Home / Self Care | Payer: BC Managed Care – PPO | Attending: Physician Assistant | Admitting: Physician Assistant

## 2020-08-16 ENCOUNTER — Encounter: Payer: Self-pay | Admitting: Emergency Medicine

## 2020-08-16 DIAGNOSIS — I10 Essential (primary) hypertension: Secondary | ICD-10-CM | POA: Diagnosis not present

## 2020-08-16 DIAGNOSIS — B372 Candidiasis of skin and nail: Secondary | ICD-10-CM | POA: Diagnosis not present

## 2020-08-16 DIAGNOSIS — R300 Dysuria: Secondary | ICD-10-CM | POA: Diagnosis not present

## 2020-08-16 DIAGNOSIS — N3001 Acute cystitis with hematuria: Secondary | ICD-10-CM | POA: Insufficient documentation

## 2020-08-16 HISTORY — DX: Essential (primary) hypertension: I10

## 2020-08-16 LAB — URINALYSIS, COMPLETE (UACMP) WITH MICROSCOPIC
Bilirubin Urine: NEGATIVE
Glucose, UA: NEGATIVE mg/dL
Ketones, ur: NEGATIVE mg/dL
Leukocytes,Ua: NEGATIVE
Nitrite: POSITIVE — AB
Protein, ur: >300 mg/dL — AB
RBC / HPF: >50 RBC/hpf (ref 0–5)
Specific Gravity, Urine: 1.025 (ref 1.005–1.030)
WBC, UA: >50 WBC/hpf (ref 0–5)
pH: 5 (ref 5.0–8.0)

## 2020-08-16 MED ORDER — LISINOPRIL 20 MG PO TABS: 20.0000 mg | ORAL_TABLET | Freq: Every day | ORAL | 1 refills | Status: AC

## 2020-08-16 MED ORDER — FLUCONAZOLE 200 MG PO TABS: 200.0000 mg | ORAL_TABLET | Freq: Every day | ORAL | 0 refills | Status: AC

## 2020-08-16 MED ORDER — CIPROFLOXACIN HCL 500 MG PO TABS: 500.0000 mg | ORAL_TABLET | Freq: Two times a day (BID) | ORAL | 0 refills | Status: AC

## 2020-08-16 MED ORDER — PHENAZOPYRIDINE HCL 200 MG PO TABS: 200.0000 mg | ORAL_TABLET | Freq: Three times a day (TID) | ORAL | 0 refills | Status: DC

## 2020-08-16 NOTE — ED Triage Notes (Addendum)
Patient in today c/o pain with urination x 2 days. He states his urine is dark yellow. Patient denies discharge from his penis. Patient denies any concerns for STDs.
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
## 2020-08-16 NOTE — Discharge Instructions (Addendum)

## 2020-08-17 LAB — URINE CULTURE: Culture: >=100000 — AB

## 2020-08-18 LAB — URINE CULTURE

## 2021-03-28 IMAGING — CR DG FOOT COMPLETE 3+V*L*
3 series · 3 of 3 positions shown · non-contrast
Comparison: None.

CLINICAL DATA: Pain and tenderness the first MTP joint. Question
gout. Pain over the last few days.

EXAM:
LEFT FOOT - COMPLETE 3+ VIEW

[foot ap]
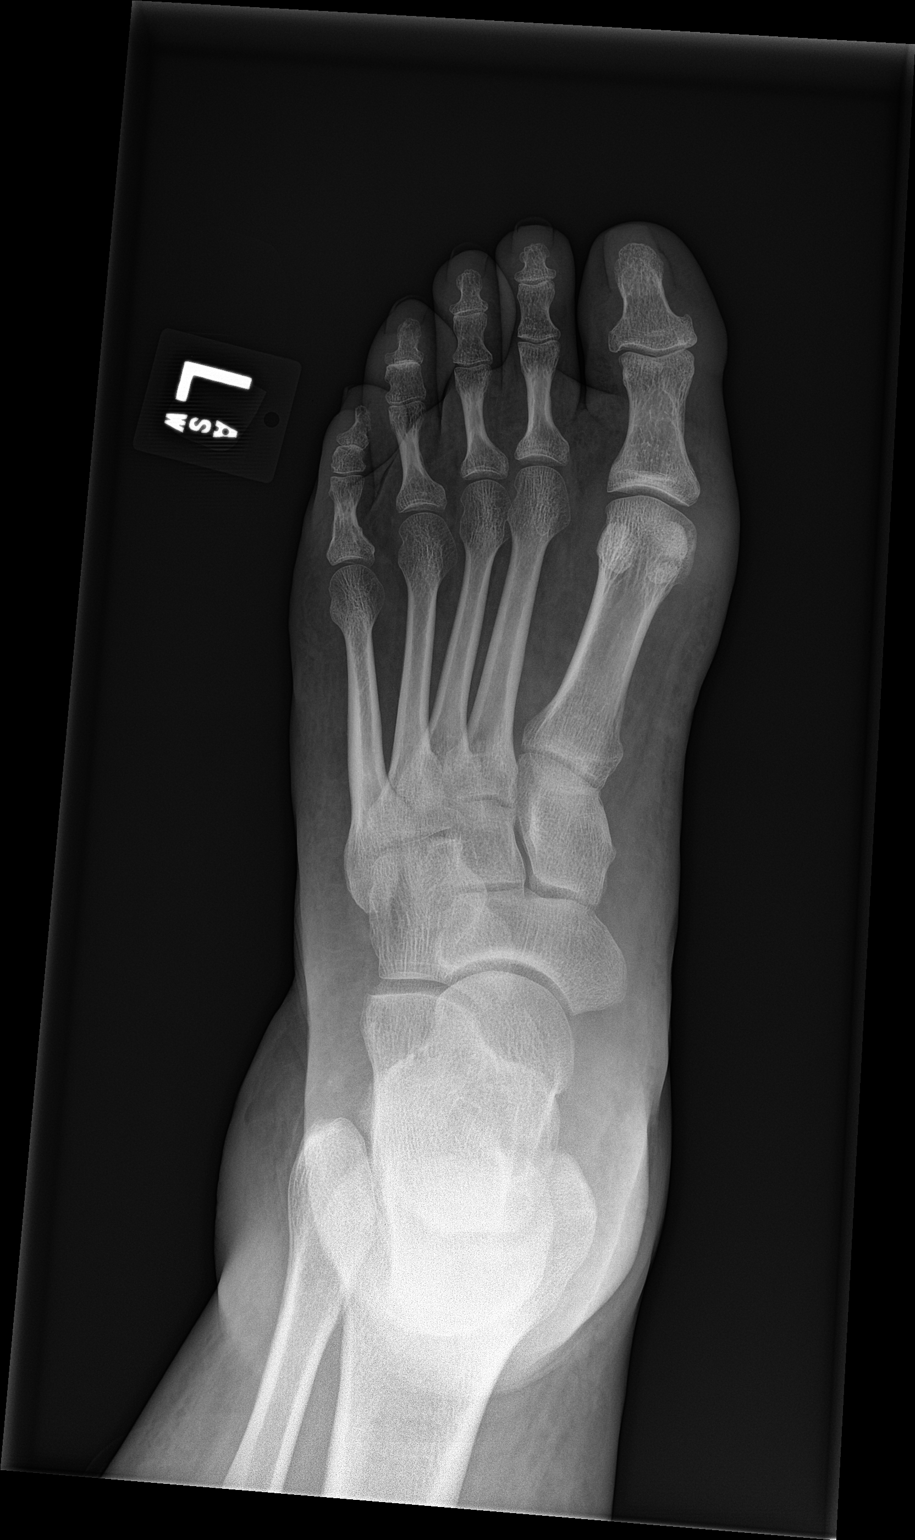

[foot obl]
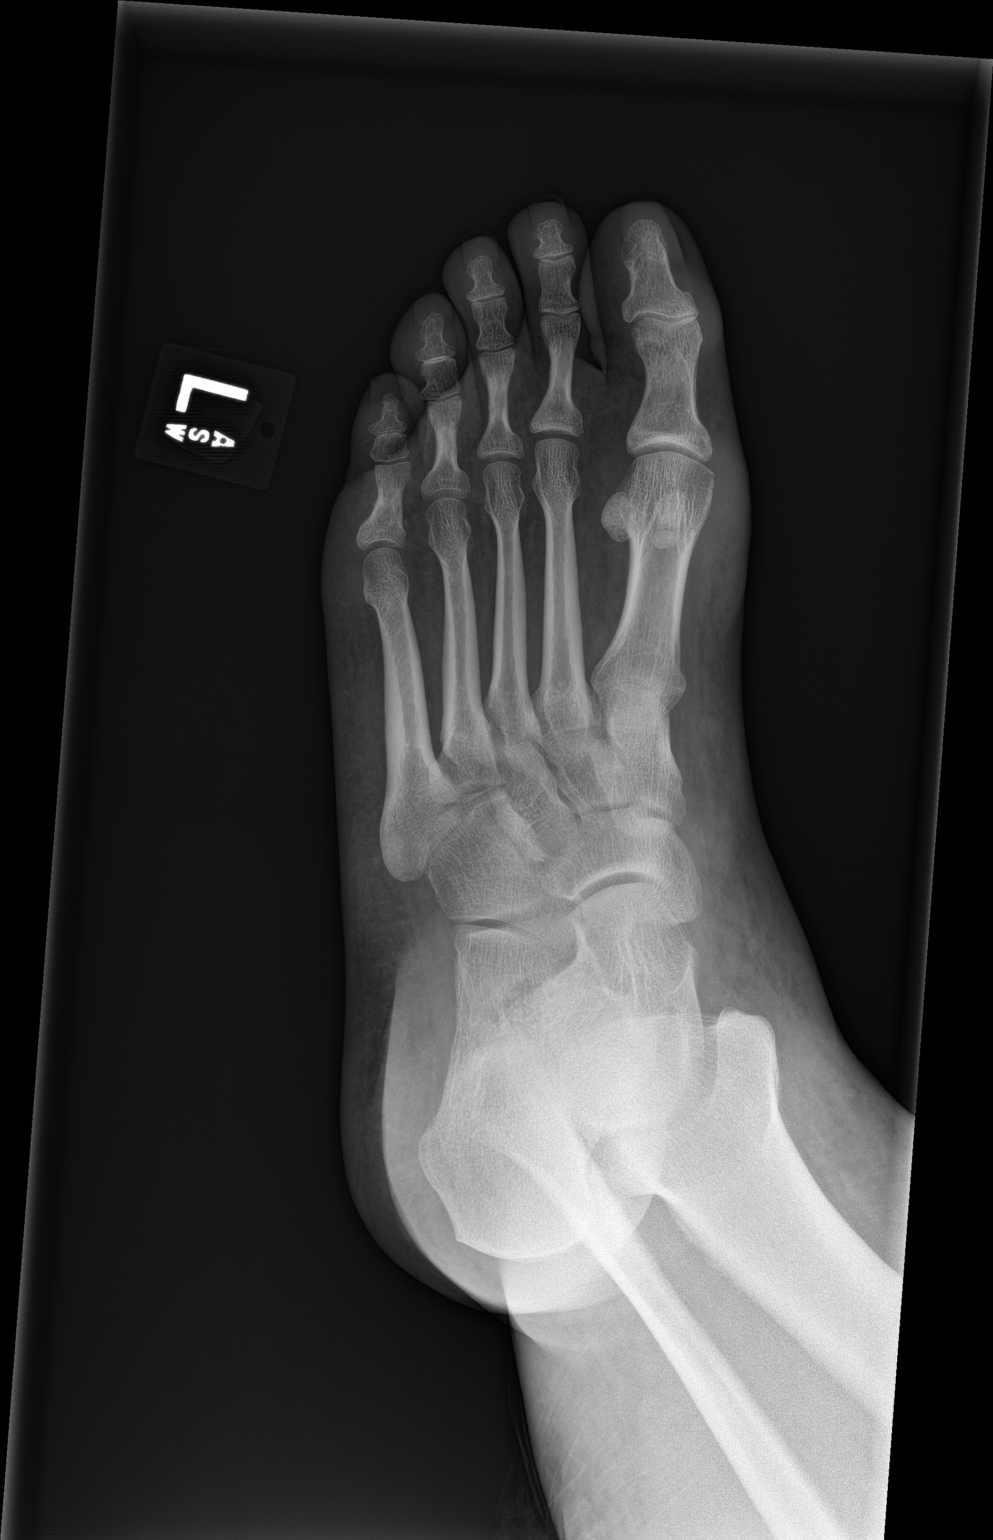

[foot lat]
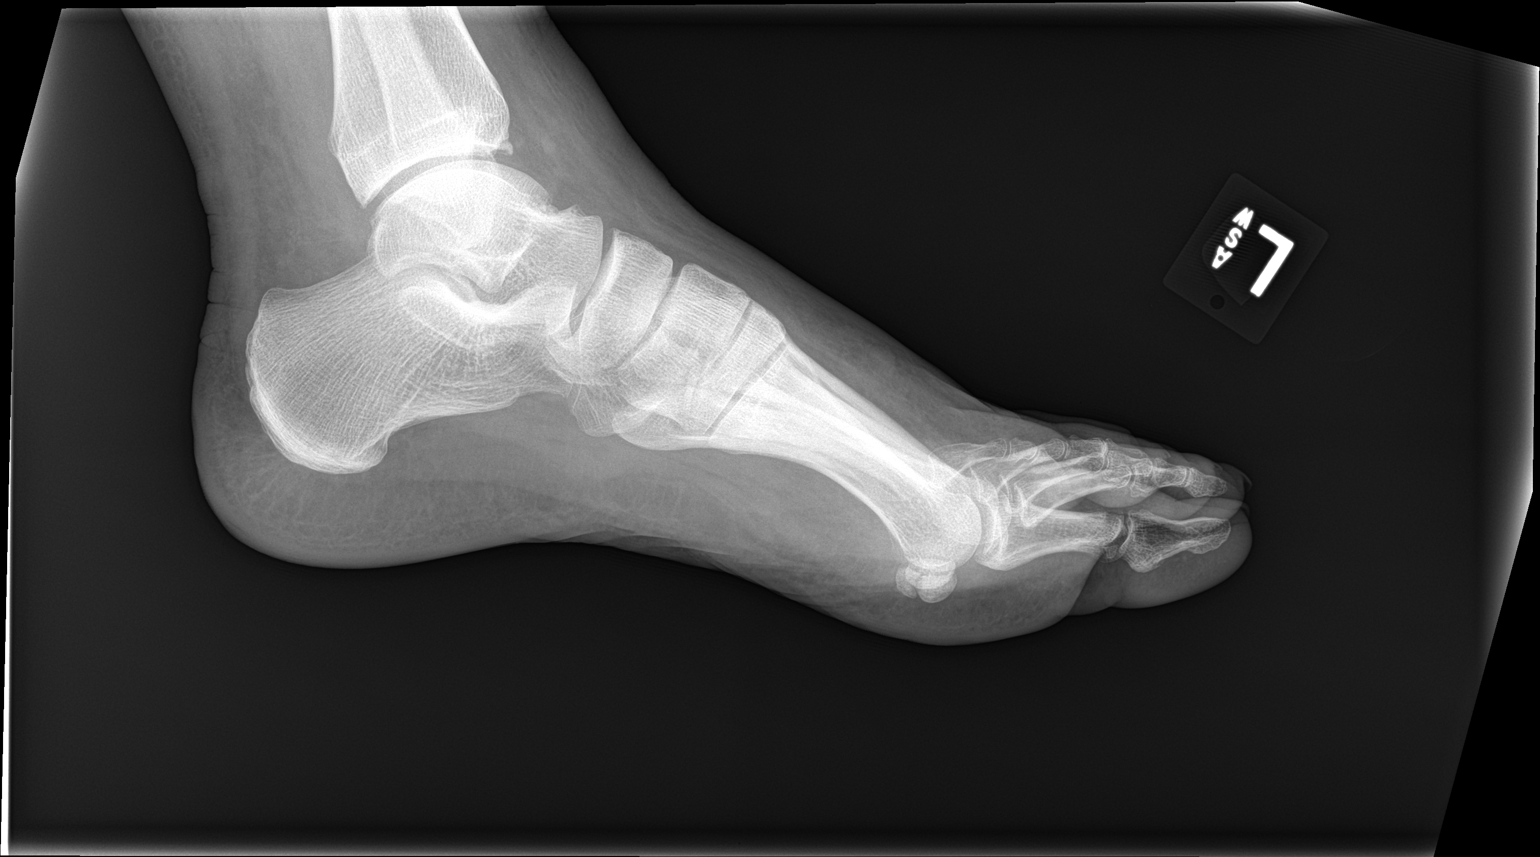

[3 of 3 positions shown; findings below may reference images not displayed]

FINDINGS: Soft swelling is noted medial to the left first MTP joint. No
osseous changes are present. No Lologe insert noted. Joint space is
preserved. Mineralization is normal. No acute or focal osseous
abnormalities are present.
IMPRESSION: 1. Soft tissue swelling medial to the left first MTP joint without
osseous changes. This is nonspecific. While this may represent
inflammatory arthritis, posttraumatic changes could give a similar
appearance.
2. No acute or focal osseous abnormality.

## 2021-09-23 DIAGNOSIS — R7303 Prediabetes: Secondary | ICD-10-CM | POA: Insufficient documentation

## 2022-02-25 ENCOUNTER — Ambulatory Visit
Admission: EM | Admit: 2022-02-25 | Discharge: 2022-02-25 | Disposition: A | Discharge status: Home / Self Care | Payer: BC Managed Care – PPO

## 2022-02-25 ENCOUNTER — Ambulatory Visit (INDEPENDENT_AMBULATORY_CARE_PROVIDER_SITE_OTHER): Payer: BC Managed Care – PPO

## 2022-02-25 ENCOUNTER — Other Ambulatory Visit: Payer: Self-pay

## 2022-02-25 ENCOUNTER — Encounter: Payer: Self-pay | Admitting: Emergency Medicine

## 2022-02-25 DIAGNOSIS — M79672 Pain in left foot: Secondary | ICD-10-CM

## 2022-02-25 DIAGNOSIS — S93402A Sprain of unspecified ligament of left ankle, initial encounter: Secondary | ICD-10-CM

## 2022-02-25 MED ORDER — METHYLPREDNISOLONE SODIUM SUCC 125 MG IJ SOLR
80.0000 mg | Freq: Once | INTRAMUSCULAR | Status: AC
Start: 2022-02-25 — End: 2022-02-25
  Administered 2022-02-25: 80 mg via INTRAMUSCULAR

## 2022-02-25 NOTE — Discharge Instructions (Addendum)
-  Your x-rays look normal.  This means that you do not have a broken bone, but you do have an ankle sprain. -Ankle brace with standing and walking while pain persists -Tylenol, ibuprofen, elevation, ice, rest. -If symptoms persist in 1 to 2 weeks, follow-up with orthopedist-information below.

## 2022-02-25 NOTE — ED Triage Notes (Signed)
Patient states that he was getting out of his tractor trailer last night and stepped down on his left foot and felt a pop in his left ankle.  Patient reports swelling and pain that has increased in his right ankle.
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
## 2022-02-27 ENCOUNTER — Telehealth: Payer: Self-pay | Admitting: Emergency Medicine

## 2022-02-27 MED ORDER — NAPROXEN SODIUM 550 MG PO TABS: 550.0000 mg | ORAL_TABLET | Freq: Two times a day (BID) | ORAL | 0 refills | Status: AC | PRN

## 2022-02-27 MED ORDER — NAPROXEN SODIUM 550 MG PO TABS: 550.0000 mg | ORAL_TABLET | Freq: Two times a day (BID) | ORAL | 0 refills | Status: DC

## 2022-02-27 NOTE — Telephone Encounter (Signed)
Patient seen 1 day ago in urgent care for a sprained ankle, recommended o use of Tylenol and anti-inflammatory medicines for treatment, patient requesting prescription for NSAID, will oblige, prescribed naproxen twice daily as needed

## 2023-03-25 ENCOUNTER — Other Ambulatory Visit: Payer: Self-pay

## 2023-03-25 ENCOUNTER — Encounter: Payer: Self-pay | Admitting: *Deleted

## 2023-03-25 ENCOUNTER — Ambulatory Visit (INDEPENDENT_AMBULATORY_CARE_PROVIDER_SITE_OTHER): Payer: BC Managed Care – PPO

## 2023-03-25 ENCOUNTER — Ambulatory Visit
Admission: EM | Admit: 2023-03-25 | Discharge: 2023-03-25 | Disposition: A | Discharge status: Home / Self Care | Payer: BC Managed Care – PPO | Attending: Emergency Medicine | Admitting: Emergency Medicine

## 2023-03-25 DIAGNOSIS — J069 Acute upper respiratory infection, unspecified: Secondary | ICD-10-CM

## 2023-03-25 DIAGNOSIS — J40 Bronchitis, not specified as acute or chronic: Secondary | ICD-10-CM

## 2023-03-25 MED ORDER — PROMETHAZINE-DM 6.25-15 MG/5ML PO SYRP: 5.0000 mL | ORAL_SOLUTION | Freq: Four times a day (QID) | ORAL | 0 refills | Status: AC | PRN

## 2023-03-25 MED ORDER — IPRATROPIUM BROMIDE 0.06 % NA SOLN: 2.0000 | Freq: Four times a day (QID) | NASAL | 12 refills | Status: AC

## 2023-03-25 MED ORDER — AEROCHAMBER MV MISC: 2 refills | Status: AC

## 2023-03-25 MED ORDER — BENZONATATE 100 MG PO CAPS: 200.0000 mg | ORAL_CAPSULE | Freq: Three times a day (TID) | ORAL | 0 refills | Status: AC

## 2023-03-25 MED ORDER — PREDNISONE 20 MG PO TABS: 60.0000 mg | ORAL_TABLET | Freq: Every day | ORAL | 0 refills | Status: AC

## 2023-03-25 MED ORDER — ALBUTEROL SULFATE HFA 108 (90 BASE) MCG/ACT IN AERS: 2.0000 | INHALATION_SPRAY | RESPIRATORY_TRACT | 0 refills | Status: DC | PRN

## 2023-03-25 NOTE — ED Triage Notes (Signed)
Pt reports since last Monday he has had a productive cough with chills and muscle pain. Pt has tried OTC with out relief.
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
## 2023-03-25 NOTE — Discharge Instructions (Addendum)
Use the albuterol inhaler/nebulizer every 4-6 hours as needed for shortness of breath, wheezing, and cough.  Take the prednisone according to the package instructions to help with pulmonary inflammation.  Use the Tessalon Perles every 8 hours for your cough.  Taken with a small sip of water.  They may give you some numbness to the base of your tongue or metallic taste in her mouth, this is normal.  They are designed to calm down the cough reflex.  Use the Promethazine DM cough syrup at bedtime as will make you drowsy.  You may take 1 teaspoon (5 mL) every 6 hours.  Return for reevaluation for new or worsening symptoms.  

## 2023-04-04 ENCOUNTER — Ambulatory Visit (INDEPENDENT_AMBULATORY_CARE_PROVIDER_SITE_OTHER): Payer: BC Managed Care – PPO

## 2023-04-04 ENCOUNTER — Ambulatory Visit
Admission: RE | Admit: 2023-04-04 | Discharge: 2023-04-04 | Disposition: A | Discharge status: Home / Self Care | Payer: BC Managed Care – PPO | Source: Ambulatory Visit | Attending: Family Medicine | Admitting: Family Medicine

## 2023-04-04 VITALS — BP 161/97 | HR 88 | Temp 98.8°F | Resp 19

## 2023-04-04 DIAGNOSIS — J209 Acute bronchitis, unspecified: Secondary | ICD-10-CM | POA: Diagnosis not present

## 2023-04-04 MED ORDER — ALBUTEROL SULFATE HFA 108 (90 BASE) MCG/ACT IN AERS: 2.0000 | INHALATION_SPRAY | RESPIRATORY_TRACT | 0 refills | Status: AC | PRN

## 2023-04-04 MED ORDER — PREDNISONE 50 MG PO TABS: 50.0000 mg | ORAL_TABLET | Freq: Every day | ORAL | 0 refills | Status: AC

## 2023-04-04 MED ORDER — AMOXICILLIN-POT CLAVULANATE 875-125 MG PO TABS: 1.0000 | ORAL_TABLET | Freq: Two times a day (BID) | ORAL | 0 refills | Status: DC

## 2023-04-04 MED ORDER — LEVOFLOXACIN 750 MG PO TABS: 750.0000 mg | ORAL_TABLET | Freq: Every day | ORAL | 0 refills | Status: AC
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
## 2023-04-04 NOTE — Discharge Instructions (Signed)
Stop by the pharmacy to pick up your prescriptions.  Follow up with your primary care provider as needed.  

## 2023-04-04 NOTE — ED Triage Notes (Signed)
Pt presentswith c/o persistent cough and chest tightness X 2 weeks.   Pt is having cold sweats at night.   Headaches have been worsening.

## 2023-05-07 ENCOUNTER — Ambulatory Visit: Payer: BC Managed Care – PPO | Admitting: Internal Medicine

## 2023-05-07 NOTE — Progress Notes (Signed)
Pt did not show for scheduled appointment.  

## 2023-06-11 ENCOUNTER — Ambulatory Visit
Admission: RE | Admit: 2023-06-11 | Discharge: 2023-06-11 | Disposition: A | Discharge status: Home / Self Care | Payer: BC Managed Care – PPO | Source: Ambulatory Visit | Attending: Family Medicine | Admitting: Family Medicine

## 2023-06-11 VITALS — BP 153/111 | HR 77 | Temp 98.2°F | Resp 19

## 2023-06-11 DIAGNOSIS — I10 Essential (primary) hypertension: Secondary | ICD-10-CM

## 2023-06-11 DIAGNOSIS — N3001 Acute cystitis with hematuria: Secondary | ICD-10-CM

## 2023-06-11 LAB — URINALYSIS, W/ REFLEX TO CULTURE (INFECTION SUSPECTED)
Bilirubin Urine: NEGATIVE
Glucose, UA: NEGATIVE mg/dL
Ketones, ur: NEGATIVE mg/dL
Nitrite: NEGATIVE
Protein, ur: >300 mg/dL — AB
Specific Gravity, Urine: >1.03 — ABNORMAL HIGH (ref 1.005–1.030)
pH: 5.5 (ref 5.0–8.0)

## 2023-06-11 MED ORDER — NITROFURANTOIN MONOHYD MACRO 100 MG PO CAPS: 100.0000 mg | ORAL_CAPSULE | Freq: Two times a day (BID) | ORAL | 0 refills | Status: DC

## 2023-06-11 MED ORDER — HYDROCHLOROTHIAZIDE 25 MG PO TABS: 25.0000 mg | ORAL_TABLET | Freq: Every day | ORAL | 0 refills | Status: DC

## 2023-06-11 MED ORDER — HYDROCHLOROTHIAZIDE 25 MG PO TABS: 25.0000 mg | ORAL_TABLET | Freq: Every day | ORAL | 0 refills | Status: AC

## 2023-06-11 MED ORDER — NITROFURANTOIN MONOHYD MACRO 100 MG PO CAPS: 100.0000 mg | ORAL_CAPSULE | Freq: Two times a day (BID) | ORAL | 0 refills | Status: AC

## 2023-06-11 NOTE — ED Triage Notes (Signed)
Patient drives truck. Holds pee. Burning while peeing,abdominal pain,frequent urination. 10/10 pain

## 2023-06-11 NOTE — Discharge Instructions (Signed)
Stop by the pharmacy to pick up your prescriptions.  Follow up with your primary care provider as needed.   Please monitor your blood pressure at home first thing in the morning prior to eating and drinking and journal this for PCP follow-up. If you remain persistently elevated after your symptoms improve, you may need medical management. Other recommendations for high blood pressure are to decrease the amount of salt in your diet, exercise (150 minutes of moderate intensity exercise weekly), weight loss.  You should follow up with your primary doctor in 2 weeks regarding today's urgent care visit. If your symptoms persist, you may need a additional cardiovascular evaluation.
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
## 2023-06-12 LAB — URINE CULTURE: Culture: NO GROWTH

## 2023-08-06 DIAGNOSIS — N4883 Acquired buried penis: Secondary | ICD-10-CM | POA: Insufficient documentation

## 2023-10-06 ENCOUNTER — Ambulatory Visit: Payer: Self-pay

## 2024-02-18 ENCOUNTER — Ambulatory Visit: Payer: Self-pay | Admitting: Cardiology

## 2024-03-24 ENCOUNTER — Ambulatory Visit: Admitting: Cardiology

## 2024-05-22 ENCOUNTER — Ambulatory Visit: Attending: Cardiology | Admitting: Cardiology

## 2024-05-22 ENCOUNTER — Encounter: Payer: Self-pay | Admitting: Cardiology

## 2024-05-22 VITALS — BP 143/97 | HR 91 | Ht 73.0 in | Wt 388.6 lb

## 2024-05-22 DIAGNOSIS — R0600 Dyspnea, unspecified: Secondary | ICD-10-CM | POA: Diagnosis not present

## 2024-05-22 DIAGNOSIS — I10 Essential (primary) hypertension: Secondary | ICD-10-CM

## 2024-05-22 NOTE — Progress Notes (Signed)
 Cardiology Office Note:    Date:  05/22/2024   ID:  Lucas Pierce., DOB [birth year], MRN [id number]  PCP:  Tobie Border, MD   [facility] Providers Cardiologist:  None     Referring MD: Tobie Border, MD   Chief Complaint  Patient presents with   Hypertension    Patient seems to be out of breath. Meds reviewed.    Lucas Pierce. is a 48 y.o. male who is being seen today for the evaluation of shortness of breath, hypertension at the request of Tobie Border, MD.   History of Present Illness:    Lucas Pierce. is a 48 y.o. male with a hx of hypertension, obesity who presents due to elevated BP and shortness of breath.  States having shortness of breath with minimal exertion.  Also endorses fatigue, snoring, daytime somnolence.  Denies chest pain.  Diagnosed recently with low testosterone levels.  Planning on starting testosterone soon.  BP usually well-controlled at home, takes lisinopril 40 mg daily HCTZ 25 mg daily.  Has not taken medication yesterday or today due to medications being in his truck.  He is a [employer].  Denies immediate family history of heart disease.  Past Medical History:  Diagnosis Date   Hypertension    Obesity     Past Surgical History:  Procedure Laterality Date   abscess groin      Current Medications: Current Meds  Medication Sig   albuterol (VENTOLIN HFA) 108 (90 Base) MCG/ACT inhaler Inhale 2 puffs into the lungs every 4 (four) hours as needed.   benzonatate (TESSALON) 100 MG capsule Take 2 capsules (200 mg total) by mouth every 8 (eight) hours.   escitalopram (LEXAPRO) 10 MG tablet Take 10 mg by mouth daily.   hydrochlorothiazide (HYDRODIURIL) 25 MG tablet Take 1 tablet (25 mg total) by mouth daily.   ipratropium (ATROVENT) 0.06 % nasal spray Place 2 sprays into both nostrils 4 (four) times daily.   levofloxacin (LEVAQUIN) 750 MG tablet Take 1 tablet (750 mg total) by mouth daily.   lisdexamfetamine (VYVANSE) 50 MG  capsule Take 1 capsule by mouth daily.   lisinopril (ZESTRIL) 20 MG tablet Take 1 tablet (20 mg total) by mouth daily.   naproxen sodium (ANAPROX DS) 550 MG tablet Take 1 tablet (550 mg total) by mouth 2 (two) times daily as needed.   promethazine-dextromethorphan (PROMETHAZINE-DM) 6.25-15 MG/5ML syrup Take 5 mLs by mouth 4 (four) times daily as needed.   Spacer/Aero-Holding Chambers (AEROCHAMBER MV) inhaler Use as instructed     Allergies:   Patient has no known allergies.   Social History   Socioeconomic History   Marital status: Married    Spouse name: Not on file   Number of children: Not on file   Years of education: Not on file   Highest education level: Not on file  Occupational History   Not on file  Tobacco Use   Smoking status: Never   Smokeless tobacco: Never  Vaping Use   Vaping status: Never Used  Substance and Sexual Activity   Alcohol use: No   Drug use: No   Sexual activity: Not on file  Other Topics Concern   Not on file  Social History Narrative   Not on file   Social Drivers of Health   Financial Resource Strain: Low Risk  (10/19/2020)   Received from [facility]   Overall Financial Resource Strain (CARDIA)    Difficulty of Paying Living  Expenses: Not hard at all  Food Insecurity: No Food Insecurity (11/05/2023)   Received from [facility]   Hunger Vital Sign    Within the past 12 months, you worried that your food would run out before you got the money to buy more.: Never true    Within the past 12 months, the food you bought just didn't last and you didn't have money to get more.: Never true  Transportation Needs: No Transportation Needs (10/19/2020)   Received from [facility] - Transportation    Lack of Transportation (Medical): No    Lack of Transportation (Non-Medical): No  Physical Activity: Not on file  Stress: Not on file  Social Connections: Not on file     Family History: The patient's family history includes  Alcohol abuse in his father; Hypertension in his mother; Liver disease in his father.  ROS:   Please see the history of present illness.     All other systems reviewed and are negative.  EKGs/Labs/Other Studies Reviewed:    The following studies were reviewed today:  EKG Interpretation Date/Time:  Thursday May 22 2024 08:37:07 EDT Ventricular Rate:  91 PR Interval:  190 QRS Duration:  90 QT Interval:  386 QTC Calculation: 474 R Axis:   2  Text Interpretation: Normal sinus rhythm Inferior infarct , age undetermined Confirmed by Darliss Rogue ([id number]) on 05/22/2024 8:47:45 AM    Recent Labs: No results found for requested labs within last 365 days.  Recent Lipid Panel No results found for: CHOL, TRIG, HDL, CHOLHDL, VLDL, LDLCALC, LDLDIRECT   Risk Assessment/Calculations:            Physical Exam:    VS:  BP (!) 143/97   Pulse 91   Ht 6' 1 (1.854 m)   Wt (!) 388 lb 9.6 oz (176.3 kg)   SpO2 93%   BMI 51.27 kg/m     Wt Readings from Last 3 Encounters:  05/22/24 (!) 388 lb 9.6 oz (176.3 kg)  02/25/22 (!) 396 lb 2.7 oz (179.7 kg)  08/16/20 (!) 396 lb 3.2 oz (179.7 kg)     GEN:  Well nourished, well developed in no acute distress HEENT: Normal NECK: No JVD; No carotid bruits CARDIAC: RRR, no murmurs, rubs, gallops RESPIRATORY:  Clear to auscultation without rales, wheezing or rhonchi  ABDOMEN: Soft, non-tender, non-distended MUSCULOSKELETAL:  No edema; No deformity  SKIN: Warm and dry NEUROLOGIC:  Alert and oriented x 3 PSYCHIATRIC:  Normal affect   ASSESSMENT:    1. Primary hypertension   2. Dyspnea, unspecified type   3. Morbid obesity (HCC)    PLAN:    In order of problems listed above:  Hypertension, BP elevated today, likely from not taking meds.  Usually controlled when on medications.  Continue lisinopril 40 mg daily, HCTZ 25 mg daily. Dyspnea, obtain echocardiogram.  Dyspnea not consistent with angina.  Etiology likely  combination of morbid obesity, possible OSA, low T.  Refer to sleep specialist for OSA eval. Morbid obesity, low-calorie diet, weight loss advised.     Medication Adjustments/Labs and Tests Ordered: Current medicines are reviewed at length with the patient today.  Concerns regarding medicines are outlined above.  Orders Placed This Encounter  Procedures   Ambulatory referral to Sleep Studies   EKG 12-Lead   ECHOCARDIOGRAM COMPLETE   No orders of the defined types were placed in this encounter.   Patient Instructions  Medication Instructions:  Your physician recommends that you  continue on your current medications as directed. Please refer to the Current Medication list given to you today.   *If you need a refill on your cardiac medications before your next appointment, please call your pharmacy*  Lab Work: No labs ordered today  If you have labs (blood work) drawn today and your tests are completely normal, you will receive your results only by: MyChart Message (if you have MyChart) OR A paper copy in the mail If you have any lab test that is abnormal or we need to change your treatment, we will call you to review the results.  Testing/Procedures: Your physician has requested that you have an echocardiogram. Echocardiography is a painless test that uses sound waves to create images of your heart. It provides your doctor with information about the size and shape of your heart and how well your heart's chambers and valves are working.   You may receive an ultrasound enhancing agent through an IV if needed to better visualize your heart during the echo. This procedure takes approximately one hour.  There are no restrictions for this procedure.  This will take place at [address] [facility] [address], [place] [address]  Please note: We ask at that you not bring children with you during ultrasound (echo/ vascular) testing. Due to room size and safety concerns, children  are not allowed in the ultrasound rooms during exams. Our front office staff cannot provide observation of children in our lobby area while testing is being conducted. An adult accompanying a patient to their appointment will only be allowed in the ultrasound room at the discretion of the ultrasound technician under special circumstances. We apologize for any inconvenience.   Follow-Up: At [facility], you and your health needs are our priority.  As part of our continuing mission to provide you with exceptional heart care, our providers are all part of one team.  This team includes your primary Cardiologist (physician) and Advanced Practice Providers or APPs (Physician Assistants and Nurse Practitioners) who all work together to provide you with the care you need, when you need it.  Your next appointment:   3 month(s)  Provider:   You may see Dr. Darliss or one of the following Advanced Practice Providers on your designated Care Team:   Lonni Meager, NP Lesley Maffucci, PA-C Bernardino Bring, PA-C Cadence [place], PA-C Tylene Lunch, NP Barnie Hila, NP    We recommend signing up for the patient portal called MyChart.  Sign up information is provided on this After Visit Summary.  MyChart is used to connect with patients for Virtual Visits (Telemedicine).  Patients are able to view lab/test results, encounter notes, upcoming appointments, etc.  Non-urgent messages can be sent to your provider as well.   To learn more about what you can do with MyChart, go to [email or web address].         Signed, Redell Darliss, MD  05/22/2024 10:18 AM    [facility]

## 2024-05-22 NOTE — Patient Instructions (Signed)
 Medication Instructions:  Your physician recommends that you continue on your current medications as directed. Please refer to the Current Medication list given to you today.   *If you need a refill on your cardiac medications before your next appointment, please call your pharmacy*  Lab Work: No labs ordered today  If you have labs (blood work) drawn today and your tests are completely normal, you will receive your results only by: MyChart Message (if you have MyChart) OR A paper copy in the mail If you have any lab test that is abnormal or we need to change your treatment, we will call you to review the results.  Testing/Procedures: Your physician has requested that you have an echocardiogram. Echocardiography is a painless test that uses sound waves to create images of your heart. It provides your doctor with information about the size and shape of your heart and how well your heart's chambers and valves are working.   You may receive an ultrasound enhancing agent through an IV if needed to better visualize your heart during the echo. This procedure takes approximately one hour.  There are no restrictions for this procedure.  This will take place at [address] [facility] [address], [place] [address]  Please note: We ask at that you not bring children with you during ultrasound (echo/ vascular) testing. Due to room size and safety concerns, children are not allowed in the ultrasound rooms during exams. Our front office staff cannot provide observation of children in our lobby area while testing is being conducted. An adult accompanying a patient to their appointment will only be allowed in the ultrasound room at the discretion of the ultrasound technician under special circumstances. We apologize for any inconvenience.   Follow-Up: At [facility], you and your health needs are our priority.  As part of our continuing mission to provide you with exceptional heart  care, our providers are all part of one team.  This team includes your primary Cardiologist (physician) and Advanced Practice Providers or APPs (Physician Assistants and Nurse Practitioners) who all work together to provide you with the care you need, when you need it.  Your next appointment:   3 month(s)  Provider:   You may see Dr. Darliss or one of the following Advanced Practice Providers on your designated Care Team:   Lonni Meager, NP Lesley Maffucci, PA-C Bernardino Bring, PA-C Cadence [place], PA-C Tylene Lunch, NP Barnie Hila, NP    We recommend signing up for the patient portal called MyChart.  Sign up information is provided on this After Visit Summary.  MyChart is used to connect with patients for Virtual Visits (Telemedicine).  Patients are able to view lab/test results, encounter notes, upcoming appointments, etc.  Non-urgent messages can be sent to your provider as well.   To learn more about what you can do with MyChart, go to [email or web address].

## 2024-06-11 ENCOUNTER — Telehealth: Payer: Self-pay | Admitting: Cardiology

## 2024-06-11 DIAGNOSIS — R0600 Dyspnea, unspecified: Secondary | ICD-10-CM

## 2024-06-11 NOTE — Addendum Note (Signed)
 Addended by: DASIE SHARLET RAMAN on: 06/11/2024 12:52 PM   Modules accepted: Orders

## 2024-06-11 NOTE — Telephone Encounter (Signed)
 Lab calling in regards to Study Type on referral being missing. Please advise

## 2024-06-11 NOTE — Telephone Encounter (Signed)
 Called and spoke to sleep center. Advised to disregard because wrong referral was placed. They verbalized understanding.   Referral placed to pulmonary for sleep disorder breathing.

## 2024-06-14 DIAGNOSIS — M793 Panniculitis, unspecified: Secondary | ICD-10-CM | POA: Insufficient documentation

## 2024-06-14 DIAGNOSIS — L0390 Cellulitis, unspecified: Secondary | ICD-10-CM | POA: Insufficient documentation

## 2024-06-25 ENCOUNTER — Ambulatory Visit: Attending: Cardiology

## 2024-06-25 DIAGNOSIS — I10 Essential (primary) hypertension: Secondary | ICD-10-CM | POA: Diagnosis not present

## 2024-06-25 LAB — ECHOCARDIOGRAM COMPLETE
AR max vel: 4.05 cm2
AV Area VTI: 4.3 cm2
AV Area mean vel: 3.47 cm2
AV Mean grad: 4 mmHg
AV Peak grad: 6.1 mmHg
Ao pk vel: 1.23 m/s
Area-P 1/2: 3.03 cm2
S' Lateral: 4 cm

## 2024-06-26 ENCOUNTER — Ambulatory Visit: Payer: Self-pay | Admitting: Cardiology

## 2024-07-23 ENCOUNTER — Ambulatory Visit: Admitting: Internal Medicine

## 2024-08-06 ENCOUNTER — Ambulatory Visit: Admitting: Sleep Medicine

## 2024-09-03 ENCOUNTER — Ambulatory Visit: Attending: Cardiology | Admitting: Cardiology

## 8387-06-10 DEATH — deceased
